# Patient Record
Sex: Male | Born: 2005 | Race: White | Hispanic: No | Marital: Single
Health system: Southern US, Community
[De-identification: ages and names within clinical notes are randomized; demographics above are authoritative.]

## PROBLEM LIST (undated history)

## (undated) DIAGNOSIS — F419 Anxiety disorder, unspecified: Secondary | ICD-10-CM

## (undated) DIAGNOSIS — F32A Depression, unspecified: Secondary | ICD-10-CM

## (undated) DIAGNOSIS — F909 Attention-deficit hyperactivity disorder, unspecified type: Secondary | ICD-10-CM

## (undated) HISTORY — DX: Anxiety disorder, unspecified: F41.9

## (undated) HISTORY — PX: WISDOM TOOTH EXTRACTION: SHX21

## (undated) HISTORY — DX: Depression, unspecified: F32.A

## (undated) HISTORY — DX: Attention-deficit hyperactivity disorder, unspecified type: F90.9

---
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
## 2024-05-17 ENCOUNTER — Ambulatory Visit: Payer: Self-pay | Admitting: Family Medicine

## 2024-05-17 ENCOUNTER — Encounter: Payer: Self-pay | Admitting: Family Medicine

## 2024-05-17 VITALS — BP 125/73 | HR 93 | Temp 97.8°F | Ht 67.25 in | Wt 206.0 lb

## 2024-05-17 DIAGNOSIS — R7309 Other abnormal glucose: Secondary | ICD-10-CM | POA: Diagnosis not present

## 2024-05-17 DIAGNOSIS — F902 Attention-deficit hyperactivity disorder, combined type: Secondary | ICD-10-CM | POA: Insufficient documentation

## 2024-05-17 DIAGNOSIS — F419 Anxiety disorder, unspecified: Secondary | ICD-10-CM | POA: Insufficient documentation

## 2024-05-17 DIAGNOSIS — Z6832 Body mass index (BMI) 32.0-32.9, adult: Secondary | ICD-10-CM | POA: Diagnosis not present

## 2024-05-17 DIAGNOSIS — E782 Mixed hyperlipidemia: Secondary | ICD-10-CM | POA: Diagnosis not present

## 2024-05-17 LAB — BAYER DCA HB A1C WAIVED: HB A1C (BAYER DCA - WAIVED): 5.4 % (ref 4.8–5.6)

## 2024-05-17 NOTE — Progress Notes (Signed)
 Subjective:  Patient ID: Matthew Moran, male    DOB: [birth year], 18 y.o.   MRN: [id number]  Patient Care Team: Galvin Jules, FNP as PCP - General (Family Medicine)   Chief Complaint:  Medical Management of Chronic Issues and ADHD (Would like referral to get medicated )   HPI: Matthew Moran is a 18 y.o. male presenting on 05/17/2024 for Medical Management of Chronic Issues and ADHD (Would like referral to get medicated )   Matthew Moran is an 18 year old male who presents for a routine check-in and follow-up on elevated cholesterol.  He has not been seen since 2022 and has not consulted any other medical providers in the interim. No chest pain, leg swelling, shortness of breath, unexplained night sweats, or loss of appetite. He reports an unintentional weight loss of about 50 pounds since his last visit.  He uses delta-8 THC at a dose of 50 mg daily for the past four years, primarily for relaxation and mild anxiety, and obtains this online. Additionally, he smokes about half a pack of cigarettes daily, a habit he has maintained for the past year.  His diet consists mainly of fast food, particularly chicken, which may contribute to his elevated cholesterol levels.  He is currently in college but is on a break. He reports difficulty focusing, which has impacted his academic performance, leading to dropping summer school classes. He has a history of ADHD and is seeking a referral to a psychologist for further management.         05/17/2024   11:19 AM 04/15/2022   11:19 AM 10/02/2021    4:27 PM  GAD 7 : Generalized Anxiety Score  Nervous, Anxious, on Edge 2 1 2   Control/stop worrying 0 2 0  Worry too much - different things 0 1 0  Trouble relaxing 0 1 0  Restless 3 2 1   Easily annoyed or irritable 0 2 0  Afraid - awful might happen 0 2 0  Total GAD 7 Score 5 11 3   Anxiety Difficulty Somewhat difficult Somewhat difficult Not difficult at all       05/17/2024   11:19 AM  04/15/2022   11:18 AM 04/15/2022   11:17 AM 10/02/2021    4:27 PM 02/10/2020    8:35 AM  Depression screen PHQ 2/9  Decreased Interest 0 0 0 0 0  Down, Depressed, Hopeless 0 0 0 0 0  PHQ - 2 Score 0 0 0 0 0  Altered sleeping 2 1 1 1    Tired, decreased energy 0 2 0 1   Change in appetite 0 0 2 1   Feeling bad or failure about yourself  0 0 0 0   Trouble concentrating 3 2 2  0   Moving slowly or fidgety/restless 0 1 1 0   Suicidal thoughts 0  0 0   PHQ-9 Score 5 6 6 3    Difficult doing work/chores Somewhat difficult   Somewhat difficult      Relevant past medical, surgical, family, and social history reviewed and updated as indicated.  Allergies and medications reviewed and updated. Data reviewed: Chart in Epic.   Past Medical History:  Diagnosis Date   ADHD (attention deficit hyperactivity disorder)    Anxiety    Depression     Past Surgical History:  Procedure Laterality Date   WISDOM TOOTH EXTRACTION      Social History   Socioeconomic History   Marital status: Single    Spouse name:  Not on file   Number of children: Not on file   Years of education: Not on file   Highest education level: Some college, no degree  Occupational History   Not on file  Tobacco Use   Smoking status: Every Day    Current packs/day: 0.50    Types: Cigarettes   Smokeless tobacco: Not on file  Vaping Use   Vaping status: Never Used  Substance and Sexual Activity   Alcohol use: Yes    Comment: rare   Drug use: Yes    Types: Marijuana   Sexual activity: Not on file  Other Topics Concern   Not on file  Social History Narrative   Not on file   Social Drivers of Health   Financial Resource Strain: Low Risk  (05/16/2024)   Overall Financial Resource Strain (CARDIA)    Difficulty of Paying Living Expenses: Not hard at all  Food Insecurity: No Food Insecurity (05/16/2024)   Hunger Vital Sign    Worried About Running Out of Food in the Last Year: Never true    Ran Out of Food in the  Last Year: Never true  Transportation Needs: No Transportation Needs (05/16/2024)   PRAPARE - Administrator, Civil Service (Medical): No    Lack of Transportation (Non-Medical): No  Physical Activity: Sufficiently Active (05/16/2024)   Exercise Vital Sign    Days of Exercise per Week: 5 days    Minutes of Exercise per Session: 30 min  Stress: Stress Concern Present (05/16/2024)   [employer] of Occupational Health - Occupational Stress Questionnaire    Feeling of Stress: To some extent  Social Connections: Socially Isolated (05/16/2024)   Social Connection and Isolation Panel    Frequency of Communication with Friends and Family: More than three times a week    Frequency of Social Gatherings with Friends and Family: More than three times a week    Attends Religious Services: Never    Database administrator or Organizations: No    Attends Engineer, structural: Not on file    Marital Status: Never married  Intimate Partner Violence: Not on file    Outpatient Encounter Medications as of 05/17/2024  Medication Sig   OVER THE COUNTER MEDICATION Take 50 mg by mouth daily at 6 (six) AM.   [DISCONTINUED] fluticasone  (FLONASE ) 50 MCG/ACT nasal spray Place 2 sprays into both nostrils daily.   [DISCONTINUED] loratadine  (CLARITIN ) 10 MG tablet Take 1 tablet (10 mg total) by mouth daily.   No facility-administered encounter medications on file as of 05/17/2024.    No Known Allergies  Pertinent ROS per HPI, otherwise unremarkable      Objective:  BP 125/73   Pulse 93   Temp 97.8 F (36.6 C)   Ht 5' 7.25 (1.708 m)   Wt 206 lb (93.4 kg)   SpO2 99%   BMI 32.02 kg/m    Wt Readings from Last 3 Encounters:  05/17/24 206 lb (93.4 kg) (95%, Z= 1.64)*  04/15/22 (!) 254 lb (115.2 kg) (>99%, Z= 2.75)*  10/02/21 (!) 255 lb (115.7 kg) (>99%, Z= 2.89)*   * Growth percentiles are based on CDC (Boys, 2-20 Years) data.    Physical Exam Vitals and nursing note  reviewed.  Constitutional:      General: He is not in acute distress.    Appearance: Normal appearance. He is obese. He is not ill-appearing, toxic-appearing or diaphoretic.  HENT:     Head: Normocephalic and atraumatic.  Nose: Nose normal.     Mouth/Throat:     Mouth: Mucous membranes are moist.   Eyes:     Conjunctiva/sclera: Conjunctivae normal.     Pupils: Pupils are equal, round, and reactive to light.    Cardiovascular:     Rate and Rhythm: Normal rate and regular rhythm.     Heart sounds: Normal heart sounds.  Pulmonary:     Effort: Pulmonary effort is normal.     Breath sounds: Normal breath sounds.   Musculoskeletal:     Cervical back: Neck supple.     Right lower leg: No edema.     Left lower leg: No edema.   Skin:    General: Skin is warm and dry.     Capillary Refill: Capillary refill takes less than 2 seconds.   Neurological:     General: No focal deficit present.     Mental Status: He is alert and oriented to person, place, and time.   Psychiatric:        Mood and Affect: Mood normal.        Behavior: Behavior normal.        Thought Content: Thought content normal.        Judgment: Judgment normal.       Results for orders placed or performed in visit on 04/15/22  Rapid Strep Screen ([facility] ONLY)   Collection Time: 04/15/22 11:12 AM   Specimen: Other   Other  Result Value Ref Range   Strep Gp A Ag, IA W/Reflex Negative Negative  Culture, Group A Strep   Collection Time: 04/15/22 11:12 AM   Other  Result Value Ref Range   Strep A Culture CANCELED        Pertinent labs & imaging results that were available during my care of the patient were reviewed by me and considered in my medical decision making.  Assessment & Plan:  Saahir was seen today for medical management of chronic issues and adhd.  Diagnoses and all orders for this visit:  Anxiety -     CMP14+EGFR -     CBC with Differential/Platelet -     Thyroid  Panel With  TSH -     Ambulatory referral to Psychiatry  Mixed hyperlipidemia -     CMP14+EGFR -     Lipid panel  Elevated glucose -     CMP14+EGFR -     Bayer DCA Hb A1c Waived  BMI 32.0-32.9,adult -     CMP14+EGFR -     CBC with Differential/Platelet -     Lipid panel -     Thyroid  Panel With TSH -     Bayer DCA Hb A1c Waived  Attention deficit hyperactivity disorder (ADHD), combined type -     CMP14+EGFR -     CBC with Differential/Platelet -     Thyroid  Panel With TSH -     Ambulatory referral to Psychiatry     Attention-Deficit/Hyperactivity Disorder (ADHD) He experiences difficulty focusing, particularly in academic settings, leading to dropping summer classes. He seeks a referral to a psychologist for medication management to address academic struggles. - Refer to psychiatry for evaluation and management of ADHD.  Elevated Cholesterol He consumes a diet primarily consisting of fast food, particularly chicken, which may contribute to dyslipidemia. Reassessment of cholesterol levels is necessary to guide further management. - Order lab work to assess cholesterol levels.  Tobacco Use He smokes about half a pack of cigarettes per day and has been  doing so for a year. Smoking cessation was not discussed in detail during this visit.  Delta-8 THC Use He uses 50 mg of delta-8 THC daily for relaxation and mild anxiety, with usage spanning approximately four years. No specific intervention is planned at this time.  General Health Maintenance He has not been seen since 2022. There is a need to update lab work, including glucose levels, to monitor overall health status. - Order lab work to assess glucose levels.  Follow-up He is advised to follow up based on lab results and the referral to psychiatry. He should contact the office if he does not hear from psychiatry within a week. - Follow up on lab results. - Ensure psychiatry contacts him within a week; if not, he should inform the  office.          Continue all other maintenance medications.  Follow up plan: Return if symptoms worsen or fail to improve.   Continue healthy lifestyle choices, including diet (rich in fruits, vegetables, and lean proteins, and low in salt and simple carbohydrates) and exercise (at least 30 minutes of moderate physical activity daily).   The above assessment and management plan was discussed with the patient. The patient verbalized understanding of and has agreed to the management plan. Patient is aware to call the clinic if they develop any new symptoms or if symptoms persist or worsen. Patient is aware when to return to the clinic for a follow-up visit. Patient educated on when it is appropriate to go to the emergency department.   Kattie Parrot, FNP-C [facility] [phone]

## 2024-05-18 ENCOUNTER — Ambulatory Visit: Payer: Self-pay | Admitting: Family Medicine

## 2024-05-18 LAB — LIPID PANEL
Chol/HDL Ratio: 3.6 ratio (ref 0.0–5.0)
Cholesterol, Total: 144 mg/dL (ref 100–169)
HDL: 40 mg/dL (ref >39)
LDL Chol Calc (NIH): 85 mg/dL (ref 0–109)
Triglycerides: 99 mg/dL — ABNORMAL HIGH (ref 0–89)
VLDL Cholesterol Cal: 19 mg/dL (ref 5–40)

## 2024-05-18 LAB — CBC WITH DIFFERENTIAL/PLATELET
Basophils Absolute: 0.1 10*3/uL (ref 0.0–0.2)
Basos: 1 %
EOS (ABSOLUTE): 0.1 10*3/uL (ref 0.0–0.4)
Eos: 2 %
Hematocrit: 45.4 % (ref 37.5–51.0)
Hemoglobin: 14.5 g/dL (ref 13.0–17.7)
Immature Grans (Abs): 0 10*3/uL (ref 0.0–0.1)
Immature Granulocytes: 0 %
Lymphocytes Absolute: 2.3 10*3/uL (ref 0.7–3.1)
Lymphs: 37 %
MCH: 27.7 pg (ref 26.6–33.0)
MCHC: 31.9 g/dL (ref 31.5–35.7)
MCV: 87 fL (ref 79–97)
Monocytes Absolute: 0.6 10*3/uL (ref 0.1–0.9)
Monocytes: 9 %
Neutrophils Absolute: 3.1 10*3/uL (ref 1.4–7.0)
Neutrophils: 51 %
Platelets: 318 10*3/uL (ref 150–450)
RBC: 5.24 x10E6/uL (ref 4.14–5.80)
RDW: 13.4 % (ref 11.6–15.4)
WBC: 6.2 10*3/uL (ref 3.4–10.8)

## 2024-05-18 LAB — THYROID PANEL WITH TSH
Free Thyroxine Index: 2.3 (ref 1.2–4.9)
T3 Uptake Ratio: 29 % (ref 24–38)
T4, Total: 8.1 ug/dL (ref 4.5–12.0)
TSH: 3.34 u[IU]/mL (ref 0.450–4.500)

## 2024-05-18 LAB — CMP14+EGFR
ALT: 23 IU/L (ref 0–44)
AST: 47 IU/L — ABNORMAL HIGH (ref 0–40)
Albumin: 4.3 g/dL (ref 4.3–5.2)
Alkaline Phosphatase: 77 IU/L (ref 51–125)
BUN/Creatinine Ratio: 13 (ref 9–20)
BUN: 10 mg/dL (ref 6–20)
Bilirubin Total: <0.2 mg/dL (ref 0.0–1.2)
CO2: 21 mmol/L (ref 20–29)
Calcium: 9.4 mg/dL (ref 8.7–10.2)
Chloride: 103 mmol/L (ref 96–106)
Creatinine, Ser: 0.76 mg/dL (ref 0.76–1.27)
Globulin, Total: 2.3 g/dL (ref 1.5–4.5)
Glucose: 91 mg/dL (ref 70–99)
Potassium: 4.9 mmol/L (ref 3.5–5.2)
Sodium: 140 mmol/L (ref 134–144)
Total Protein: 6.6 g/dL (ref 6.0–8.5)
eGFR: 134 mL/min/{1.73_m2} (ref >59)

## 2025-05-19 ENCOUNTER — Encounter: Payer: Self-pay | Admitting: Family Medicine
